# Patient Record
Sex: Female | Born: 1985
Health system: Southern US, Community
[De-identification: ages and names within clinical notes are randomized; demographics above are authoritative.]

## PROBLEM LIST (undated history)

## (undated) HISTORY — PX: CHOLECYSTECTOMY: SHX55

---

## 2017-09-17 DIAGNOSIS — Z361 Encounter for antenatal screening for raised alphafetoprotein level: Secondary | ICD-10-CM | POA: Diagnosis not present

## 2017-10-08 DIAGNOSIS — Z363 Encounter for antenatal screening for malformations: Secondary | ICD-10-CM | POA: Diagnosis not present

## 2017-12-03 DIAGNOSIS — Z3A27 27 weeks gestation of pregnancy: Secondary | ICD-10-CM | POA: Diagnosis not present

## 2017-12-03 DIAGNOSIS — Z3493 Encounter for supervision of normal pregnancy, unspecified, third trimester: Secondary | ICD-10-CM | POA: Diagnosis not present

## 2017-12-03 DIAGNOSIS — I82409 Acute embolism and thrombosis of unspecified deep veins of unspecified lower extremity: Secondary | ICD-10-CM | POA: Diagnosis not present

## 2017-12-03 DIAGNOSIS — Z130 Encounter for screening for diseases of the blood and blood-forming organs and certain disorders involving the immune mechanism: Secondary | ICD-10-CM | POA: Diagnosis not present

## 2017-12-03 DIAGNOSIS — Z23 Encounter for immunization: Secondary | ICD-10-CM | POA: Diagnosis not present

## 2017-12-09 DIAGNOSIS — O99810 Abnormal glucose complicating pregnancy: Secondary | ICD-10-CM | POA: Diagnosis not present

## 2018-01-23 DIAGNOSIS — M545 Low back pain: Secondary | ICD-10-CM | POA: Diagnosis not present

## 2018-01-23 DIAGNOSIS — Z3A34 34 weeks gestation of pregnancy: Secondary | ICD-10-CM | POA: Diagnosis not present

## 2018-01-23 DIAGNOSIS — O26899 Other specified pregnancy related conditions, unspecified trimester: Secondary | ICD-10-CM | POA: Diagnosis not present

## 2018-01-28 DIAGNOSIS — R309 Painful micturition, unspecified: Secondary | ICD-10-CM | POA: Diagnosis not present

## 2018-01-28 DIAGNOSIS — M545 Low back pain: Secondary | ICD-10-CM | POA: Diagnosis not present

## 2018-01-30 DIAGNOSIS — R1084 Generalized abdominal pain: Secondary | ICD-10-CM | POA: Diagnosis not present

## 2018-01-30 DIAGNOSIS — R319 Hematuria, unspecified: Secondary | ICD-10-CM | POA: Diagnosis not present

## 2018-01-30 DIAGNOSIS — O9989 Other specified diseases and conditions complicating pregnancy, childbirth and the puerperium: Secondary | ICD-10-CM | POA: Diagnosis not present

## 2018-01-30 DIAGNOSIS — O26893 Other specified pregnancy related conditions, third trimester: Secondary | ICD-10-CM | POA: Diagnosis not present

## 2018-01-30 DIAGNOSIS — Z3A35 35 weeks gestation of pregnancy: Secondary | ICD-10-CM | POA: Diagnosis not present

## 2018-01-30 DIAGNOSIS — Z791 Long term (current) use of non-steroidal anti-inflammatories (NSAID): Secondary | ICD-10-CM | POA: Diagnosis not present

## 2018-01-30 DIAGNOSIS — M549 Dorsalgia, unspecified: Secondary | ICD-10-CM | POA: Diagnosis not present

## 2018-02-04 DIAGNOSIS — Z3493 Encounter for supervision of normal pregnancy, unspecified, third trimester: Secondary | ICD-10-CM | POA: Diagnosis not present

## 2018-03-02 DIAGNOSIS — O43893 Other placental disorders, third trimester: Secondary | ICD-10-CM | POA: Diagnosis not present

## 2018-03-02 DIAGNOSIS — Z3A40 40 weeks gestation of pregnancy: Secondary | ICD-10-CM | POA: Diagnosis not present

## 2018-03-02 DIAGNOSIS — Z8279 Family history of other congenital malformations, deformations and chromosomal abnormalities: Secondary | ICD-10-CM | POA: Diagnosis not present

## 2018-03-02 DIAGNOSIS — N5089 Other specified disorders of the male genital organs: Secondary | ICD-10-CM | POA: Diagnosis not present

## 2018-03-02 DIAGNOSIS — O480 Post-term pregnancy: Secondary | ICD-10-CM | POA: Diagnosis not present

## 2018-03-02 DIAGNOSIS — O368330 Maternal care for abnormalities of the fetal heart rate or rhythm, third trimester, not applicable or unspecified: Secondary | ICD-10-CM | POA: Diagnosis not present

## 2018-05-06 DIAGNOSIS — Z130 Encounter for screening for diseases of the blood and blood-forming organs and certain disorders involving the immune mechanism: Secondary | ICD-10-CM | POA: Diagnosis not present

## 2018-05-06 DIAGNOSIS — Z6838 Body mass index (BMI) 38.0-38.9, adult: Secondary | ICD-10-CM | POA: Diagnosis not present

## 2018-10-10 ENCOUNTER — Encounter: Payer: Self-pay | Admitting: Registered Nurse

## 2018-10-10 ENCOUNTER — Ambulatory Visit (INDEPENDENT_AMBULATORY_CARE_PROVIDER_SITE_OTHER): Payer: BC Managed Care – PPO

## 2018-10-10 ENCOUNTER — Ambulatory Visit: Payer: BC Managed Care – PPO | Admitting: Registered Nurse

## 2018-10-10 ENCOUNTER — Other Ambulatory Visit: Payer: Self-pay

## 2018-10-10 VITALS — BP 121/82 | HR 95 | Temp 99.0°F | Resp 18 | Ht 64.37 in | Wt 242.0 lb

## 2018-10-10 DIAGNOSIS — G8929 Other chronic pain: Secondary | ICD-10-CM

## 2018-10-10 DIAGNOSIS — M545 Low back pain, unspecified: Secondary | ICD-10-CM

## 2018-10-10 NOTE — Patient Instructions (Signed)
° ° ° °  If you have lab work done today you will be contacted with your lab results within the next 2 weeks.  If you have not heard from us then please contact us. The fastest way to get your results is to register for My Chart. ° ° °IF you received an x-ray today, you will receive an invoice from [facility]. Please contact [facility] at [phone] with questions or concerns regarding your invoice.  ° °IF you received labwork today, you will receive an invoice from [facility]. Please contact [facility] at [phone] with questions or concerns regarding your invoice.  ° °Our billing staff will not be able to assist you with questions regarding bills from these companies. ° °You will be contacted with the lab results as soon as they are available. The fastest way to get your results is to activate your My Chart account. Instructions are located on the last page of this paperwork. If you have not heard from us regarding the results in 2 weeks, please contact this office. °  ° ° ° °

## 2018-10-10 NOTE — Progress Notes (Signed)
New Patient Office Visit  Subjective:  Patient ID: Erica Wilkinson, female    DOB: 20-Jun-1985  Age: 33 y.o. MRN: [id number]  CC:  Chief Complaint  Patient presents with  . Establish Care  . Back Pain    pt states she has been having pain/spasms in her lower back since December after child birth    HPI Erica Wilkinson presents for visit to establish care and back pain/spasm  States that this started in December after childbirth and has gotten worse. She states that she had an epidural, and the anesthesiologist had told her that this was likely an after effect. However, She has been in contact with the anesthesiologist since, who stated that it was likely not an effect of the epidural. She has had a history of back pain, mentioning that she has seen chiropractors in the past. However, she hasn't found one since moving to [place] from [place].  Denies numbness, weakness, tingling in legs. Denies sciatic pain. Denies loss of bowel or bladder control. States that she has gained more weight since delivering than she gained during pregnancy. Will order Xr of lumbar spine on site today.   Otherwise, denies major medical history. Hoping to establish care today as well, as she does not have a PCP in [place] yet.   No past medical history on file.  Past Surgical History:  Procedure Laterality Date  . CHOLECYSTECTOMY      No family history on file.  Social History   Socioeconomic History  . Marital status: Single    Spouse name: Not on file  . Number of children: Not on file  . Years of education: Not on file  . Highest education level: Not on file  Occupational History  . Not on file  Social Needs  . Financial resource strain: Not on file  . Food insecurity    Worry: Not on file    Inability: Not on file  . Transportation needs    Medical: Not on file    Non-medical: Not on file  Tobacco Use  . Smoking status: Former Research scientist (life sciences)  . Smokeless tobacco: Never Used  Substance and Sexual Activity  .  Alcohol use: Not on file  . Drug use: Not on file  . Sexual activity: Not on file  Lifestyle  . Physical activity    Days per week: Not on file    Minutes per session: Not on file  . Stress: Not on file  Relationships  . Social Herbalist on phone: Not on file    Gets together: Not on file    Attends religious service: Not on file    Active member of club or organization: Not on file    Attends meetings of clubs or organizations: Not on file    Relationship status: Not on file  . Intimate partner violence    Fear of current or ex partner: Not on file    Emotionally abused: Not on file    Physically abused: Not on file    Forced sexual activity: Not on file  Other Topics Concern  . Not on file  Social History Narrative  . Not on file    ROS Review of Systems  Constitutional: Negative.   HENT: Negative.   Eyes: Negative.   Respiratory: Negative.   Cardiovascular: Negative.   Gastrointestinal: Negative.   Endocrine: Negative.   Genitourinary: Negative.   Musculoskeletal: Positive for arthralgias (L knee), back pain and myalgias (lower back).  Skin:  Negative.   Allergic/Immunologic: Negative.   Neurological: Negative.   Hematological: Negative.   Psychiatric/Behavioral: Negative.   All other systems reviewed and are negative.   Objective:   Today's Vitals: BP 121/82   Pulse 95   Temp 99 F (37.2 C) (Oral)   Resp 18   Ht 5' 4.37" (1.635 m)   Wt 242 lb (109.8 kg)   LMP 09/12/2018 (Approximate)   SpO2 97%   BMI 41.06 kg/m   Physical Exam Vitals signs and nursing note reviewed.  Constitutional:      General: She is not in acute distress.    Appearance: Normal appearance. She is normal weight. She is not ill-appearing, toxic-appearing or diaphoretic.  HENT:     Head: Normocephalic and atraumatic.  Cardiovascular:     Rate and Rhythm: Normal rate and regular rhythm.  Pulmonary:     Effort: Pulmonary effort is normal. No respiratory distress.   Musculoskeletal:     Lumbar back: She exhibits decreased range of motion and deformity.  Skin:    General: Skin is warm and dry.     Coloration: Skin is not jaundiced or pale.     Findings: No bruising, erythema, lesion or rash.  Neurological:     General: No focal deficit present.     Mental Status: She is alert. Mental status is at baseline.  Psychiatric:        Mood and Affect: Mood normal.        Behavior: Behavior normal.        Thought Content: Thought content normal.        Judgment: Judgment normal.     Assessment & Plan:   Problem List Items Addressed This Visit    None    Visit Diagnoses    Low back pain, unspecified back pain laterality, unspecified chronicity, unspecified whether sciatica present    -  Primary   Relevant Orders   DG Lumbar Spine Complete (Completed)      No outpatient encounter medications on file as of 10/10/2018.   No facility-administered encounter medications on file as of 10/10/2018.     Follow-up: No follow-ups on file.   PLAN  XR of lumbar spine shows scoliosis. Maintains good joint spacing. At this time, likely that she is experiencing back pain from a combination of muscle imbalance, recent weight gain, and longstanding scoliosis. We will refer to physical therapy - in absence of red flag symptoms, we can avoid a spine specialist at this time.   Reviewed in depth the reasons for further concern: loss of bowel or bladder control, numbness, tingling, or burning in extremities, weakness in one or both feet, and more.  Otherwise, we did review her medical history today and established care. She seems, besides her back pain, healthy for the most part.  Discussed non pharm: exercise, weight loss.  Follow up in around 3 mos for condition check, CPE  Patient encouraged to call clinic with any questions, comments, or concerns.  Janeece Ageeichard Manroop Jakubowicz, NP

## 2018-11-13 ENCOUNTER — Encounter: Payer: Self-pay | Admitting: Physical Therapy

## 2018-11-13 ENCOUNTER — Other Ambulatory Visit: Payer: Self-pay

## 2018-11-13 ENCOUNTER — Ambulatory Visit: Payer: BC Managed Care – PPO | Attending: Registered Nurse | Admitting: Physical Therapy

## 2018-11-13 DIAGNOSIS — M545 Low back pain: Secondary | ICD-10-CM | POA: Diagnosis not present

## 2018-11-13 DIAGNOSIS — G8929 Other chronic pain: Secondary | ICD-10-CM

## 2018-11-13 NOTE — Therapy (Signed)
[facility] [address] [place], [place], [address] Phone: [phone]   Fax:  [phone]  Physical Therapy Evaluation  Patient Details  Name: Erica Wilkinson MRN: [id number] Date of Birth: 1985/09/22 Referring Provider (PT): Janeece Agee, NP   Encounter Date: 11/13/2018  PT End of Session - 11/13/18 1112    Visit Number  1    Number of Visits  12    Date for PT Re-Evaluation  12/25/18    Authorization Type  BCBS    PT Start Time  1007    PT Stop Time  1100    PT Time Calculation (min)  53 min    Activity Tolerance  Patient tolerated treatment well    Behavior During Therapy  [facility] for tasks assessed/performed       History reviewed. No pertinent past medical history.  Past Surgical History:  Procedure Laterality Date  . CHOLECYSTECTOMY      There were no vitals filed for this visit.   Subjective Assessment - 11/13/18 1014    Subjective  She relays Lower back pain r/t scoliosis. No red flag symptoms. Recent imaging on file. L leg likely shorter than R leg d/t scoliosis and former heel lift. States that this started in December after childbirth and has gotten worse.She has had a history of back pain, mentioning that she has seen chiropractors in the past. However, she hasn't found one since moving to [place] from [place]. Pain with bending or picking up, or carrying son. She relays Lt leg is shorter and sheDenies numbness, weakness, tingling in legs. Denies sciatic pain.    Pertinent History  scoliosis, recent childbirth in December 2019    Limitations  Lifting;Standing;Walking;House hold activities    How long can you sit comfortably?  not limited    How long can you stand comfortably?  has to stand 8 hours at work and pain after 6 hours    How long can you walk comfortably?  not limited unless hours of walking    Diagnostic tests  lumbar XR: There is thoracolumbar levoscoliosis with slight rotatory component. There is no fracture  or spondylolisthesis.    Patient Stated Goals  get rid of the pain    Currently in Pain?  Yes    Pain Score  6     Pain Location  Back    Pain Orientation  Lower;Right    Pain Descriptors / Indicators  Aching;Throbbing;Burning    Pain Type  Chronic pain    Pain Radiating Towards  denies    Pain Onset  More than a month ago    Pain Frequency  Intermittent    Aggravating Factors   bending, twisting, lifting    Pain Relieving Factors  tyelonol and rest         [facility] PT Assessment - 11/13/18 0001      Assessment   Medical Diagnosis  Lower back pain r/t scoliosis.    Referring Provider (PT)  Janeece Agee, NP    Onset Date/Surgical Date  --   More pain since December 2020 after giving birth   Hand Dominance  Right    Next MD Visit  6 months or as needed    Prior Therapy  none, has tried chiro      Precautions   Precautions  None      Balance Screen   Has the patient fallen in the past 6 months  No      Home Environment   Living  Environment  Private residence      Prior Function   Level of Independence  Independent    Vocation  Full time employment    Vocation Requirements  works at [employer] so has to be up on her feet 8 hours and do some lifting, pushing, pulling, bending      Cognition   Overall Cognitive Status  Within Functional Limits for tasks assessed      Observation/Other Assessments   Scoliosis   There is thoracolumbar levoscoliosis with slight rotary component    Focus on Therapeutic Outcomes (FOTO)   was not set up for her      Sensation   Light Touch  Appears Intact      Coordination   Gross Motor Movements are Fluid and Coordinated  Yes      Posture/Postural Control   Posture Comments  mild scoliosis, consider further pelvis allignment and LLD next visit      ROM / Strength   AROM / PROM / Strength  AROM;Strength      AROM   Overall AROM Comments  pain with sidebending only on Rt lumbar    AROM Assessment Site  Lumbar    Lumbar Flexion  75%     Lumbar Extension  WNL    Lumbar - Right Side Bend  50%    Lumbar - Left Side Bend  50%    Lumbar - Right Rotation  WNL    Lumbar - Left Rotation  WNL      Strength   Overall Strength Comments  leg strength WNL bilat      Flexibility   Soft Tissue Assessment /Muscle Length  --   very tight hamstrings, mod tight lumbar P.S Rt more than Lt     Palpation   Spinal mobility  normal    Palpation comment  TTP Rt lumbar paraspinals and mild pain L3-5      Special Tests   Other special tests  neg SLR ,neg slump test, neg FABERS test, no significant change with repeated flexion or extension but maybe flexion a little better      Transfers   Transfers  Independent with all Transfers      Ambulation/Gait   Gait Comments  WNL pattern and velocity                Objective measurements completed on examination: See above findings.      [facility] Adult PT Treatment/Exercise - 11/13/18 0001      Modalities   Modalities  Electrical Stimulation;Moist Heat      Moist Heat Therapy   Number Minutes Moist Heat  10 Minutes    Moist Heat Location  Lumbar Spine      Electrical Stimulation   Electrical Stimulation Location  lumbar    Electrical Stimulation Action  IFC    Electrical Stimulation Parameters  tolerance    Electrical Stimulation Goals  Pain      Manual Therapy   Manual therapy comments  LAD rt leg both with hip IR and ER, SL lumbar mobs/manipulation              PT Education - 11/13/18 1111    Education Details  HEP, POC, exam findings, TENS    Person(s) Educated  Patient    Methods  Explanation;Demonstration;Verbal cues;Handout    Comprehension  Verbalized understanding;Need further instruction          PT Long Term Goals - 11/13/18 1119      PT LONG  TERM GOAL #1   Title  Pt will be I and compliant with HEP. (Target goal for all goals 6 weeks 12/25/18)    Status  New      PT LONG TERM GOAL #2   Title  Pt will improve lumbar ROM to WNL to improve  mobility    Status  New      PT LONG TERM GOAL #3   Title  Pt will reduce pain by overall 50%    Baseline  6    Status  New      PT LONG TERM GOAL #4   Title  Pt will report improved sleeping quality due to less pain    Status  New      PT LONG TERM GOAL #5   Title  Pt will demonstrate appropriate body mechanics with lifting as she needs to do this at work and to pick up her young child.    Status  New             Plan - 11/13/18 1112    Clinical Impression Statement  Pt presents with LBP and mild thoracolumbar levoscoliosis. Pain is chronic in nature and made worse with recent childbirth. Special testing negative today and her pain appears to be more muscular in nature and more Rt sided. She has overall decreased lumbar ROM, decresaed core strength, poor posture, and increased pain paticularly with lifting, bending, twisting, or prolonged standing. She will benefit from skilled PT to address her defcitis.    Personal Factors and Comorbidities  Comorbidity 1;Time since onset of injury/illness/exacerbation    Comorbidities  chronic pain made worse by recent childbirth, scoliosis.    Examination-Activity Limitations  Bend;Squat;Stand;Lift;Sleep    Examination-Participation Restrictions  Meal Prep;Cleaning;Community Activity;Laundry    Stability/Clinical Decision Making  Evolving/Moderate complexity    Clinical Decision Making  Moderate    Rehab Potential  Good    PT Frequency  2x / week   1-2   PT Duration  6 weeks    PT Treatment/Interventions  Aquatic Therapy;Cryotherapy;Electrical Stimulation;Iontophoresis 4mg /ml Dexamethasone;Moist Heat;Traction;Ultrasound;Therapeutic activities;Therapeutic exercise;Neuromuscular re-education;Manual techniques;Passive range of motion;Dry needling;Joint Manipulations;Spinal Manipulations;Taping    PT Next Visit Plan  how was HEP, did she get TENS unit, needs lumbar and hamstring stretching, core, go over bodymechanics and lifting mechanics as  she does this for work, consider manipulations, traction, DN if necessary    PT Home Exercise Plan  [facility], child pose in front and to her Lt (to stretch Rt more), HSS, bird dog, prone lying on 2 pillows    Consulted and Agree with Plan of Care  Patient       Patient will benefit from skilled therapeutic intervention in order to improve the following deficits and impairments:  Decreased activity tolerance, Decreased strength, Decreased range of motion, Impaired flexibility, Increased muscle spasms, Increased fascial restricitons, Postural dysfunction, Pain, Improper body mechanics  Visit Diagnosis: Chronic bilateral low back pain without sciatica     Problem List There are no active problems to display for this patient.   Birdie RiddleBrian R Layani Foronda,PT,DPT 11/13/2018, 11:22 AM  [facility] [address] [place], [place], [address] Phone: [phone]   Fax:  [phone]  Name: Erica Wilkinson MRN: [id number] Date of Birth: 11/27/1985

## 2018-11-13 NOTE — Patient Instructions (Addendum)
Access Code: [email or web address]  URL: [email or web address]  Date: 11/13/2018  Prepared by: Elsie Ra   Exercises  Supine Single Knee to Chest - 3 sets - 30 hold - 2x daily - 6x weekly  Child's Pose Stretch - 3 sets - 30 hold - 2x daily - 6x weekly  Child's Pose with Sidebending - 3 sets - 30 hold - 2x daily - 6x weekly  Lying Prone with 2 Pillows - 10 reps - 3 sets - 2x daily - 6x weekly  Bird Dog - 10 reps - 1 sets - 5 sec hold - 2x daily - 6x weekly  Seated Hamstring Stretch - 3 sets - 30 hold - 2x daily - 6x weekly   TENS UNIT: This is helpful for muscle pain and spasm.   Search and Purchase a TENS 7000 2nd edition at www.tenspros.com. It should be less than $30.     TENS unit instructions: Do not shower or bathe with the unit on Turn the unit off before removing electrodes or batteries If the electrodes lose stickiness add a drop of water to the electrodes after they are disconnected from the unit and place on plastic sheet. If you continued to have difficulty, call the TENS unit company to purchase more electrodes. Do not apply lotion on the skin area prior to use. Make sure the skin is clean and dry as this will help prolong the life of the electrodes. After use, always check skin for unusual red areas, rash or other skin difficulties. If there are any skin problems, does not apply electrodes to the same area. Never remove the electrodes from the unit by pulling the wires. Do not use the TENS unit or electrodes other than as directed. Do not change electrode placement without consultating your therapist or physician. Keep 2 fingers with between each electrode. Wear time ratio is 2:1, on to off times.    For example on for 30 minutes off for 15 minutes and then on for 30 minutes off for 15 minutes

## 2018-11-17 ENCOUNTER — Other Ambulatory Visit: Payer: Self-pay

## 2018-11-17 ENCOUNTER — Ambulatory Visit: Payer: BC Managed Care – PPO | Admitting: Physical Therapy

## 2018-11-17 ENCOUNTER — Encounter: Payer: Self-pay | Admitting: Physical Therapy

## 2018-11-17 DIAGNOSIS — G8929 Other chronic pain: Secondary | ICD-10-CM | POA: Diagnosis not present

## 2018-11-17 DIAGNOSIS — M545 Low back pain: Secondary | ICD-10-CM | POA: Diagnosis not present

## 2018-11-17 NOTE — Therapy (Signed)
[place], [place], [address] Phone: [phone]   Fax:  [phone]  Physical Therapy Treatment  Patient Details  Name: Erica Wilkinson MRN: [id number] Date of Birth: [birth year] Referring Provider (PT): Maximiano Coss, NP   Encounter Date: 11/17/2018  PT End of Session - 11/17/18 1023    Visit Number  2    Number of Visits  12    Date for PT Re-Evaluation  12/25/18    Authorization Type  BCBS    PT Start Time  8875   in late   PT Stop Time  1102    PT Time Calculation (min)  39 min    Activity Tolerance  Patient tolerated treatment well    Behavior During Therapy  [facility] for tasks assessed/performed       History reviewed. No pertinent past medical history.  Past Surgical History:  Procedure Laterality Date  . CHOLECYSTECTOMY      There were no vitals filed for this visit.  Subjective Assessment - 11/17/18 1023    Subjective  Pt reports she hasn't really had any pain, has doen the HEP for 2 days and is doing well with them.    Currently in Pain?  No/denies         [facility] PT Assessment - 11/17/18 0001      Assessment   Medical Diagnosis  Lower back pain r/t scoliosis.    Referring Provider (PT)  Maximiano Coss, NP                   [facility] Adult PT Treatment/Exercise - 11/17/18 0001      Self-Care   Self-Care  Lifting;ADL's    ADL's  per handout    Lifting  per handout      Exercises   Exercises  Lumbar      Lumbar Exercises: Stretches   Passive Hamstring Stretch  Left;Right;3 reps;30 seconds   supine with strap   ITB Stretch  Left;Right;3 reps;30 seconds   supine cross body with strap     Lumbar Exercises: Aerobic   Stationary Bike  L3x5'      Lumbar Exercises: Standing   Other Standing Lumbar Exercises  pracitced hip hinging progressed to deadlift - added in 15# 2x10 from stool     Other Standing Lumbar Exercises  2x10 hip abduction with UE reach to engage obliques  and QL VC for form      Lumbar Exercises: Quadruped   Opposite Arm/Leg Raise  Left arm/Right leg;Right arm/Left leg;10 reps   drawing knee to elbow, then only knee in 10 reps            PT Education - 11/17/18 1029    Education Details  body mechanics    Person(s) Educated  Patient    Methods  Explanation;Demonstration;Handout    Comprehension  Returned demonstration;Verbalized understanding          PT Long Term Goals - 11/13/18 1119      PT LONG TERM GOAL #1   Title  Pt will be I and compliant with HEP. (Target goal for all goals 6 weeks 12/25/18)    Status  New      PT LONG TERM GOAL #2   Title  Pt will improve lumbar ROM to WNL to improve mobility    Status  New      PT LONG TERM GOAL #3   Title  Pt will reduce pain by overall 50%  Baseline  6    Status  New      PT LONG TERM GOAL #4   Title  Pt will report improved sleeping quality due to less pain    Status  New      PT LONG TERM GOAL #5   Title  Pt will demonstrate appropriate body mechanics with lifting as she needs to do this at work and to pick up her young child.    Status  New            Plan - 11/17/18 1050    Clinical Impression Statement  This is Micaela's second visit.  She is doing well with her initial HEP.  WAs able to verbalize understanding of Arts development officer education and will work on using these at home and work.  No goals met at this time.    Rehab Potential  Good    PT Frequency  2x / week    PT Duration  6 weeks    PT Treatment/Interventions  Aquatic Therapy;Cryotherapy;Electrical Stimulation;Iontophoresis 44m/ml Dexamethasone;Moist Heat;Traction;Ultrasound;Therapeutic activities;Therapeutic exercise;Neuromuscular re-education;Manual techniques;Passive range of motion;Dry needling;Joint Manipulations;Spinal Manipulations;Taping    PT Next Visit Plan  see if she was able to practice safe body mechanics and cont with functional core strength and low back/hip flexibility        Patient will benefit from skilled therapeutic intervention in order to improve the following deficits and impairments:  Decreased activity tolerance, Decreased strength, Decreased range of motion, Impaired flexibility, Increased muscle spasms, Increased fascial restricitons, Postural dysfunction, Pain, Improper body mechanics  Visit Diagnosis: Chronic bilateral low back pain without sciatica     Problem List There are no active problems to display for this patient.   SJeral PinchPT  11/17/2018, 11:03 AM  [facility] [address]Phone: [phone]  Fax:  [phone] Name: EDula HavlikMRN: [id number] of Birth: 2May 25, 1987

## 2018-11-17 NOTE — Patient Instructions (Signed)
Access Code: [email or web address]  URL: [email or web address]  Date: 11/17/2018  Prepared by: Jeral Pinch   Patient Education  Posture and Body Mechanics

## 2018-11-26 ENCOUNTER — Other Ambulatory Visit: Payer: Self-pay

## 2018-11-26 ENCOUNTER — Encounter: Payer: Self-pay | Admitting: Physical Therapy

## 2018-11-26 ENCOUNTER — Ambulatory Visit: Payer: BC Managed Care – PPO | Admitting: Physical Therapy

## 2018-11-26 DIAGNOSIS — M545 Low back pain, unspecified: Secondary | ICD-10-CM

## 2018-11-26 DIAGNOSIS — G8929 Other chronic pain: Secondary | ICD-10-CM | POA: Diagnosis not present

## 2018-11-26 NOTE — Therapy (Signed)
[facility] [address] [place], [place], [address] Phone: [phone]   Fax:  [phone]  Physical Therapy Treatment  Patient Details  Name: Erica Wilkinson MRN: [id number] Date of Birth: 19-Apr-1985 Referring Provider (PT): Janeece Agee, NP   Encounter Date: 11/26/2018  PT End of Session - 11/26/18 1030    Visit Number  3    Number of Visits  12    Date for PT Re-Evaluation  12/25/18    Authorization Type  BCBS    PT Start Time  1023   Patient 7 minutes late   PT Stop Time  1101    PT Time Calculation (min)  38 min    Activity Tolerance  Patient tolerated treatment well    Behavior During Therapy  [facility] for tasks assessed/performed       History reviewed. No pertinent past medical history.  Past Surgical History:  Procedure Laterality Date  . CHOLECYSTECTOMY      There were no vitals filed for this visit.  Subjective Assessment - 11/26/18 1029    Subjective  Patient reports that her pain hasn't been too bad. She did not get too sore after the last visit. She has been workoing on her exercises.    Pertinent History  scoliosis, recent childbirth in December 2019    Limitations  Lifting;Standing;Walking;House hold activities    How long can you sit comfortably?  not limited    How long can you stand comfortably?  has to stand 8 hours at work and pain after 6 hours    How long can you walk comfortably?  not limited unless hours of walking    Diagnostic tests  lumbar XR: There is thoracolumbar levoscoliosis with slight rotatory component. There is no fracture or spondylolisthesis.    Patient Stated Goals  get rid of the pain    Currently in Pain?  No/denies                       [facility] Adult PT Treatment/Exercise - 11/26/18 0001      Lumbar Exercises: Stretches   Passive Hamstring Stretch  Left;Right;2 reps;20 seconds   supine with strap   Passive Hamstring Stretch Limitations  with strap    ITB  Stretch  Left;Right;30 seconds;2 reps   supine cross body with strap     Lumbar Exercises: Supine   AB Set Limitations  reviewed abdominal breathing x5     Clam Limitations  x10 with red band. Given for home     Bent Knee Raise Limitations  2x10 with abdominal bracing     Bridge Limitations  x10 given for home.     Straight Leg Raise  10 reps      Lumbar Exercises: Quadruped   Opposite Arm/Leg Raise  Right arm/Left leg;Left arm/Right leg;5 reps      Manual Therapy   Manual therapy comments  bilateral LAD. Patient also given a heel lift as she has had benefit from this in the past. Showed patient how to insert and adjust.              PT Education - 11/26/18 1030    Education Details  reviewed technique with ther-ex    Person(s) Educated  Patient    Methods  Explanation;Demonstration;Tactile cues;Verbal cues    Comprehension  Verbalized understanding;Returned demonstration;Verbal cues required;Tactile cues required          PT Long Term Goals - 11/26/18 1251  PT LONG TERM GOAL #1   Title  Pt will be I and compliant with HEP. (Target goal for all goals 6 weeks 12/25/18)    Baseline  No HEP    Time  6    Period  Weeks    Status  New      PT LONG TERM GOAL #2   Title  Pt will improve lumbar ROM to WNL to improve mobility    Baseline  flexion limited 75% rotation limited 50% bilateral    Time  6    Period  Weeks    Status  New      PT LONG TERM GOAL #3   Title  Pt will reduce pain by overall 50%    Baseline  Pain can reach a 5/10. Progressing towards goal    Time  6    Period  Weeks    Status  New      PT LONG TERM GOAL #4   Title  Pt will report improved sleeping quality due to less pain    Baseline  Sleeping better but still not sleeping through the night    Time  6    Period  Weeks    Status  New      PT LONG TERM GOAL #5   Title  Pt will demonstrate appropriate body mechanics with lifting as she needs to do this at work and to pick up her young  child.    Baseline  worked on hip hinge today but needs further cuing    Time  6    Period  Weeks    Status  New            Plan - 11/26/18 1244    Clinical Impression Statement  The patient is making progress. She had no pain with mat exercises today.She continues to require cuing for proper hip hinge. She wpiuld benefit from skilled therapy 1w1 and 2W1 per [employer] guidlines. Overall she present with limited lumbar flexion (75% limited) and bilateral rotation (50% limited) She hads a right leg length decrepency. She was given a shoe lift today and was advised to bring her shoes in next time.    Personal Factors and Comorbidities  Comorbidity 1;Time since onset of injury/illness/exacerbation    Comorbidities  chronic pain made worse by recent childbirth, scoliosis.    Examination-Activity Limitations  Bend;Squat;Stand;Lift;Sleep    Examination-Participation Restrictions  Meal Prep;Cleaning;Community Activity;Laundry    Stability/Clinical Decision Making  Evolving/Moderate complexity    Clinical Decision Making  Moderate    Rehab Potential  Good    PT Treatment/Interventions  Aquatic Therapy;Cryotherapy;Electrical Stimulation;Iontophoresis 4mg /ml Dexamethasone;Moist Heat;Traction;Ultrasound;Therapeutic activities;Therapeutic exercise;Neuromuscular re-education;Manual techniques;Passive range of motion;Dry needling;Joint Manipulations;Spinal Manipulations;Taping    PT Next Visit Plan  continue to work on hip hinge; continue to progress core strength, Assess shoe lift if she brings her shoes in .    PT Home Exercise Plan  [facility], child pose in front and to her Lt (to stretch Rt more), HSS, bird dog, prone lying on 2 pillows; Bridge, SLR, clamshell    Consulted and Agree with Plan of Care  Patient       Patient will benefit from skilled therapeutic intervention in order to improve the following deficits and impairments:  Decreased activity tolerance, Decreased strength, Decreased range of  motion, Impaired flexibility, Increased muscle spasms, Increased fascial restricitons, Postural dysfunction, Pain, Improper body mechanics  Visit Diagnosis: Chronic bilateral low back pain without sciatica     Problem List  There are no active problems to display for this patient.   Carney Living PT DPT  11/26/2018, 1:10 PM  [facility] [address] [place], [place], [address] Phone: [phone]   Fax:  [phone]  Name: Erica Wilkinson MRN: [id number] Date of Birth: February 06, 1986

## 2018-11-28 ENCOUNTER — Other Ambulatory Visit: Payer: Self-pay

## 2018-11-28 ENCOUNTER — Ambulatory Visit: Payer: BC Managed Care – PPO | Admitting: Physical Therapy

## 2018-11-28 ENCOUNTER — Encounter: Payer: Self-pay | Admitting: Physical Therapy

## 2018-11-28 DIAGNOSIS — G8929 Other chronic pain: Secondary | ICD-10-CM

## 2018-11-28 DIAGNOSIS — M545 Low back pain: Secondary | ICD-10-CM | POA: Diagnosis not present

## 2018-11-28 NOTE — Therapy (Signed)
[place], [place], [address] Phone: [phone]   Fax:  [phone]  Physical Therapy Treatment  Patient Details  Name: Vertie Dibbern MRN: [id number] Date of Birth: 1985-03-18 Referring Provider (PT): Maximiano Coss, NP   Encounter Date: 11/28/2018  PT End of Session - 11/28/18 1316    Visit Number  4    Number of Visits  12    Date for PT Re-Evaluation  12/25/18    Authorization Type  BCBS    PT Start Time  1021   Patient 56 minutes late   PT Stop Time  1100    PT Time Calculation (min)  39 min    Activity Tolerance  Patient tolerated treatment well    Behavior During Therapy  [facility] for tasks assessed/performed       History reviewed. No pertinent past medical history.  Past Surgical History:  Procedure Laterality Date  . CHOLECYSTECTOMY      There were no vitals filed for this visit.  Subjective Assessment - 11/28/18 1026    Subjective  Patient feels like she is a little tight this morning. She has been doing Medical sales representative. She had no increase in pain after the last visit.    Pertinent History  scoliosis, recent childbirth in December 2019    Limitations  Lifting;Standing;Walking;House hold activities    How long can you sit comfortably?  not limited    How long can you stand comfortably?  has to stand 8 hours at work and pain after 6 hours    How long can you walk comfortably?  not limited unless hours of walking    Diagnostic tests  lumbar XR: There is thoracolumbar levoscoliosis with slight rotatory component. There is no fracture or spondylolisthesis.    Currently in Pain?  No/denies   just tightness in the lower back                      [facility] Adult PT Treatment/Exercise - 11/28/18 0001      Lumbar Exercises: Stretches   Passive Hamstring Stretch  Left;Right;2 reps;20 seconds   supine with strap   Passive Hamstring Stretch Limitations  with strap    ITB Stretch   Left;Right;30 seconds;2 reps   supine cross body with strap     Lumbar Exercises: Aerobic   Stationary Bike  L3x5'      Lumbar Exercises: Standing   Other Standing Lumbar Exercises  hip abduction 2x10       Lumbar Exercises: Supine   Clam Limitations  x10 with red band. Given for home     Bent Knee Raise Limitations  2x10 with abdominal bracing     Bridge Limitations  2x10       Manual Therapy   Manual therapy comments  bilateral LAD. Patient also given a heel lift as she has had benefit from this in the past. Showed patient how to insert and adjust.  Assessed  soft tissue on the right side. No trigger noted              PT Education - 11/28/18 1029    Education Details  technique with ther-ex    Person(s) Educated  Patient    Methods  Explanation;Demonstration;Verbal cues;Tactile cues    Comprehension  Verbalized understanding;Verbal cues required;Tactile cues required;Returned demonstration          PT Long Term Goals - 11/26/18 1251      PT LONG TERM GOAL #1  Title  Pt will be I and compliant with HEP. (Target goal for all goals 6 weeks 12/25/18)    Baseline  No HEP    Time  6    Period  Weeks    Status  New      PT LONG TERM GOAL #2   Title  Pt will improve lumbar ROM to WNL to improve mobility    Baseline  flexion limited 75% rotation limited 50% bilateral    Time  6    Period  Weeks    Status  New      PT LONG TERM GOAL #3   Title  Pt will reduce pain by overall 50%    Baseline  Pain can reach a 5/10. Progressing towards goal    Time  6    Period  Weeks    Status  New      PT LONG TERM GOAL #4   Title  Pt will report improved sleeping quality due to less pain    Baseline  Sleeping better but still not sleeping through the night    Time  6    Period  Weeks    Status  New      PT LONG TERM GOAL #5   Title  Pt will demonstrate appropriate body mechanics with lifting as she needs to do this at work and to pick up her young child.    Baseline   worked on hip hinge today but needs further cuing    Time  6    Period  Weeks    Status  New            Plan - 11/28/18 1316    Clinical Impression Statement  Patient given standing exercises. She had no increase in pain. She is just having tightness at this point. Therapy will continue to progress core strengthening and stretching as tolerated.    Examination-Participation Restrictions  Meal Prep;Cleaning;Community Activity;Laundry    Stability/Clinical Decision Making  Evolving/Moderate complexity    Clinical Decision Making  Moderate    Rehab Potential  Good    PT Duration  6 weeks    PT Treatment/Interventions  Aquatic Therapy;Cryotherapy;Electrical Stimulation;Iontophoresis 4mg /ml Dexamethasone;Moist Heat;Traction;Ultrasound;Therapeutic activities;Therapeutic exercise;Neuromuscular re-education;Manual techniques;Passive range of motion;Dry needling;Joint Manipulations;Spinal Manipulations;Taping    PT Next Visit Plan  continue to work on hip hinge; continue to progress core strength, Assess shoe lift if she brings her shoes in  Consdier sink stretch,    PT Home Exercise Plan  [facility], child pose in front and to her Lt (to stretch Rt more), HSS, bird dog, prone lying on 2 pillows; Bridge, SLR, clamshell       Patient will benefit from skilled therapeutic intervention in order to improve the following deficits and impairments:  Decreased activity tolerance, Decreased strength, Decreased range of motion, Impaired flexibility, Increased muscle spasms, Increased fascial restricitons, Postural dysfunction, Pain, Improper body mechanics  Visit Diagnosis: Chronic bilateral low back pain without sciatica     Problem List There are no active problems to display for this patient.    PT DPT  11/28/2018, 1:18 PM  [facility] [address] [place], [place], [place] Phone: [phone]   Fax:  [phone]  Name: Daneen Volcy MRN: Therisa Doyne Date of Birth: 12/27/1985

## 2018-12-02 ENCOUNTER — Ambulatory Visit: Payer: BC Managed Care – PPO | Admitting: Physical Therapy

## 2018-12-02 ENCOUNTER — Other Ambulatory Visit: Payer: Self-pay

## 2018-12-02 DIAGNOSIS — G8929 Other chronic pain: Secondary | ICD-10-CM

## 2018-12-02 DIAGNOSIS — M545 Low back pain, unspecified: Secondary | ICD-10-CM

## 2018-12-02 NOTE — Therapy (Signed)
[place], [place], [address] Phone: [phone]   Fax:  [phone]  Physical Therapy Treatment  Patient Details  Name: Erica Wilkinson MRN: [id number] Date of Birth: 10-27-1985 Referring Provider (PT): Maximiano Coss, NP   Encounter Date: 12/02/2018  PT End of Session - 12/02/18 1005    Visit Number  5    Number of Visits  12    Date for PT Re-Evaluation  12/25/18    Authorization Type  BCBS    PT Start Time  [phone]   pt late   PT Stop Time  1001    PT Time Calculation (min)  39 min    Activity Tolerance  Patient tolerated treatment well    Behavior During Therapy  [facility] for tasks assessed/performed       No past medical history on file.  Past Surgical History:  Procedure Laterality Date  . CHOLECYSTECTOMY      There were no vitals filed for this visit.  Subjective Assessment - 12/02/18 0944    Subjective  Pt relays no pain upon arrival but has pain if she stands too long, no more pain picking up her child but pain if she holds him too long    Pertinent History  scoliosis, recent childbirth in December 2019    Diagnostic tests  lumbar XR: There is thoracolumbar levoscoliosis with slight rotatory component. There is no fracture or spondylolisthesis.    Patient Stated Goals  get rid of the pain    Currently in Pain?  No/denies                       [facility] Adult PT Treatment/Exercise - 12/02/18 0001      Lumbar Exercises: Stretches   Single Knee to Chest Stretch  Right;Left;2 reps;30 seconds    Other Lumbar Stretch Exercise  child pose 30 sec X 2 fwd, and 30 sec X 2 going to her Lt to stretch her Rt      Lumbar Exercises: Machines for Strengthening   Other Lumbar Machine Exercise  cable machine rows and ext with 7 lbs each handle X 20 ea then push pull X 5 ea, then pilof press X 10 lbs one cable X 15 each side,      Lumbar Exercises: Standing   Other Standing Lumbar Exercises   functional deadlift from 8 inch step 10 lbs X 10, then farmers carry 10 lbs one lap (115 ft) in Rt hand and one lap in left hand      Lumbar Exercises: Supine   Bridge Limitations  2x10       Lumbar Exercises: Quadruped   Opposite Arm/Leg Raise  10 reps             PT Education - 12/02/18 1004    Education Details  lifting mechanics    Person(s) Educated  Patient    Methods  Explanation;Demonstration;Verbal cues    Comprehension  Verbalized understanding;Returned demonstration          PT Long Term Goals - 12/02/18 1007      PT LONG TERM GOAL #1   Title  Pt will be I and compliant with HEP. (Target goal for all goals 6 weeks 12/25/18)    Baseline  No HEP    Time  6    Period  Weeks    Status  On-going      PT LONG TERM GOAL #2   Title  Pt will  improve lumbar ROM to WNL to improve mobility    Baseline  flexion limited 75% rotation limited 50% bilateral    Time  6    Period  Weeks    Status  On-going      PT LONG TERM GOAL #3   Title  Pt will reduce pain by overall 50%    Baseline  Pain can reach a 5/10. Progressing towards goal    Time  6    Period  Weeks    Status  Partially Met      PT LONG TERM GOAL #4   Title  Pt will report improved sleeping quality due to less pain    Baseline  Sleeping better but still not sleeping through the night    Time  6    Period  Weeks    Status  On-going      PT LONG TERM GOAL #5   Title  Pt will demonstrate appropriate body mechanics with lifting as she needs to do this at work and to pick up her young child.    Baseline  showed good demonstration today    Time  6    Period  Weeks    Status  Achieved            Plan - 12/02/18 1005    Clinical Impression Statement  She had good tolerance to therapeutic activity progression today with functional lifting, push/pulls, and farmers carry. Lifting mechanics reviewed and she showed good return demonstration with this. PT will continue to progress as able with focus on  more standing activity    Examination-Participation Restrictions  Meal Prep;Cleaning;Community Activity;Laundry    Stability/Clinical Decision Making  Evolving/Moderate complexity    Rehab Potential  Good    PT Duration  6 weeks    PT Treatment/Interventions  Aquatic Therapy;Cryotherapy;Electrical Stimulation;Iontophoresis 41m/ml Dexamethasone;Moist Heat;Traction;Ultrasound;Therapeutic activities;Therapeutic exercise;Neuromuscular re-education;Manual techniques;Passive range of motion;Dry needling;Joint Manipulations;Spinal Manipulations;Taping    PT Next Visit Plan  continue to work on hip hinge; continue to progress core strength, Assess shoe lift if she brings her shoes in  Consdier sink stretch,    PT Home Exercise Plan  [facility], child pose in front and to her Lt (to stretch Rt more), HSS, bird dog, prone lying on 2 pillows; Bridge, SLR, clamshell    Consulted and Agree with Plan of Care  Patient       Patient will benefit from skilled therapeutic intervention in order to improve the following deficits and impairments:  Decreased activity tolerance, Decreased strength, Decreased range of motion, Impaired flexibility, Increased muscle spasms, Increased fascial restricitons, Postural dysfunction, Pain, Improper body mechanics  Visit Diagnosis: Chronic bilateral low back pain without sciatica     Problem List There are no active problems to display for this patient.   BSilvestre Mesi9/29/2020, 10:08 AM  [facility] [address]Phone: [phone]  Fax:  [phone] Name: EVermelle CammarataMRN: [id number] of Birth: [birth year]

## 2018-12-05 ENCOUNTER — Other Ambulatory Visit: Payer: Self-pay

## 2018-12-05 ENCOUNTER — Ambulatory Visit: Payer: BC Managed Care – PPO | Attending: Registered Nurse | Admitting: Physical Therapy

## 2018-12-05 ENCOUNTER — Encounter: Payer: Self-pay | Admitting: Physical Therapy

## 2018-12-05 DIAGNOSIS — M545 Low back pain: Secondary | ICD-10-CM | POA: Diagnosis not present

## 2018-12-05 DIAGNOSIS — G8929 Other chronic pain: Secondary | ICD-10-CM | POA: Diagnosis not present

## 2018-12-05 NOTE — Therapy (Signed)
[place], [place], [address] Phone: [phone]   Fax:  [phone]  Physical Therapy Treatment  Patient Details  Name: Erica Wilkinson MRN: [id number] Date of Birth: 16-Jan-1986 Referring Provider (PT): Maximiano Coss, NP   Encounter Date: 12/05/2018  PT End of Session - 12/05/18 1037    Visit Number  6    Number of Visits  12    Date for PT Re-Evaluation  12/25/18    Authorization Type  BCBS    PT Start Time  1540   Patient 7 mnutes late.   PT Stop Time  1100    PT Time Calculation (min)  38 min    Activity Tolerance  Patient tolerated treatment well    Behavior During Therapy  [facility] for tasks assessed/performed       History reviewed. No pertinent past medical history.  Past Surgical History:  Procedure Laterality Date  . CHOLECYSTECTOMY      There were no vitals filed for this visit.  Subjective Assessment - 12/05/18 1027    Subjective  Patient reports a little soreness after the weight carry last visit but she is having no pain today.    Pertinent History  scoliosis, recent childbirth in December 2019    Limitations  Lifting;Standing;Walking;House hold activities    How long can you sit comfortably?  not limited    How long can you stand comfortably?  has to stand 8 hours at work and pain after 6 hours    How long can you walk comfortably?  not limited unless hours of walking    Diagnostic tests  lumbar XR: There is thoracolumbar levoscoliosis with slight rotatory component. There is no fracture or spondylolisthesis.    Patient Stated Goals  get rid of the pain    Currently in Pain?  No/denies                       [facility] Adult PT Treatment/Exercise - 12/05/18 0001      Lumbar Exercises: Stretches   Passive Hamstring Stretch  Left;Right;2 reps;20 seconds   supine with strap   Passive Hamstring Stretch Limitations  with strap    Piriformis Stretch  3 reps;20  seconds;Right;Left      Lumbar Exercises: Supine   Clam Limitations  x10 with green     Bridge Limitations  2x10       Manual Therapy   Manual therapy comments  bilateral LAD 4 min              PT Education - 12/05/18 1037    Education Details  reviewed posture and lifting mechanics    Person(s) Educated  Patient    Methods  Explanation;Demonstration;Tactile cues;Verbal cues    Comprehension  Verbalized understanding;Returned demonstration;Verbal cues required;Tactile cues required          PT Long Term Goals - 12/02/18 1007      PT LONG TERM GOAL #1   Title  Pt will be I and compliant with HEP. (Target goal for all goals 6 weeks 12/25/18)    Baseline  No HEP    Time  6    Period  Weeks    Status  On-going      PT LONG TERM GOAL #2   Title  Pt will improve lumbar ROM to WNL to improve mobility    Baseline  flexion limited 75% rotation limited 50% bilateral    Time  6  Period  Weeks    Status  On-going      PT LONG TERM GOAL #3   Title  Pt will reduce pain by overall 50%    Baseline  Pain can reach a 5/10. Progressing towards goal    Time  6    Period  Weeks    Status  Partially Met      PT LONG TERM GOAL #4   Title  Pt will report improved sleeping quality due to less pain    Baseline  Sleeping better but still not sleeping through the night    Time  6    Period  Weeks    Status  On-going      PT LONG TERM GOAL #5   Title  Pt will demonstrate appropriate body mechanics with lifting as she needs to do this at work and to pick up her young child.    Baseline  showed good demonstration today    Time  6    Period  Weeks    Status  Achieved            Plan - 12/05/18 1039    Clinical Impression Statement  Patient continues to tolerate treatment well. She had no increase in pain with treatment.She tolerated functional activity better today. Therapy will continue to advance as tolerated.    Personal Factors and Comorbidities  Comorbidity 1;Time  since onset of injury/illness/exacerbation    Comorbidities  chronic pain made worse by recent childbirth, scoliosis.    Examination-Activity Limitations  Bend;Squat;Stand;Lift;Sleep    Examination-Participation Restrictions  Meal Prep;Cleaning;Community Activity;Laundry    Stability/Clinical Decision Making  Evolving/Moderate complexity    Clinical Decision Making  Moderate    PT Frequency  2x / week    PT Duration  6 weeks    PT Treatment/Interventions  Aquatic Therapy;Cryotherapy;Electrical Stimulation;Iontophoresis 27m/ml Dexamethasone;Moist Heat;Traction;Ultrasound;Therapeutic activities;Therapeutic exercise;Neuromuscular re-education;Manual techniques;Passive range of motion;Dry needling;Joint Manipulations;Spinal Manipulations;Taping    PT Next Visit Plan  continue to work on hip hinge; continue to progress core strength, Assess shoe lift if she brings her shoes in  Consdier sink stretch,    PT Home Exercise Plan  [facility], child pose in front and to her Lt (to stretch Rt more), HSS, bird dog, prone lying on 2 pillows; Bridge, SLR, clamshell    Consulted and Agree with Plan of Care  Patient       Patient will benefit from skilled therapeutic intervention in order to improve the following deficits and impairments:  Decreased activity tolerance, Decreased strength, Decreased range of motion, Impaired flexibility, Increased muscle spasms, Increased fascial restricitons, Postural dysfunction, Pain, Improper body mechanics  Visit Diagnosis: Chronic bilateral low back pain without sciatica     Problem List There are no active problems to display for this patient.   DCarney Living PT DPT  12/05/2018, 12:27 PM  [facility] [address]Phone: [phone]  Fax:  [phone] Name: EKyiesha MillwardMRN: [id number] of Birth: [birth year]

## 2018-12-09 ENCOUNTER — Ambulatory Visit: Payer: BC Managed Care – PPO | Admitting: Physical Therapy

## 2018-12-09 ENCOUNTER — Encounter: Payer: Self-pay | Admitting: Physical Therapy

## 2018-12-09 ENCOUNTER — Other Ambulatory Visit: Payer: Self-pay

## 2018-12-09 DIAGNOSIS — G8929 Other chronic pain: Secondary | ICD-10-CM

## 2018-12-09 DIAGNOSIS — M545 Low back pain: Secondary | ICD-10-CM | POA: Diagnosis not present

## 2018-12-10 ENCOUNTER — Encounter: Payer: Self-pay | Admitting: Physical Therapy

## 2018-12-10 NOTE — Therapy (Signed)
[place], [place], [address] Phone: [phone]   Fax:  [phone]  Physical Therapy Treatment  Patient Details  Name: Erica Wilkinson MRN: [id number] Date of Birth: 21-Jan-1986 Referring Provider (PT): Maximiano Coss, NP   Encounter Date: 12/09/2018  PT End of Session - 12/10/18 0746    Visit Number  7    Number of Visits  12    Date for PT Re-Evaluation  12/25/18    Authorization Type  BCBS    PT Start Time  1022    PT Stop Time  1100    PT Time Calculation (min)  38 min    Activity Tolerance  Patient tolerated treatment well    Behavior During Therapy  [facility] for tasks assessed/performed       History reviewed. No pertinent past medical history.  Past Surgical History:  Procedure Laterality Date  . CHOLECYSTECTOMY      There were no vitals filed for this visit.  Subjective Assessment - 12/09/18 1027    Subjective  Patient has no complaints today. She was a little sore after the last visit. She was a little sore after the last visit.    Pertinent History  scoliosis, recent childbirth in December 2019    Limitations  Lifting;Standing;Walking;House hold activities    How long can you sit comfortably?  not limited    How long can you stand comfortably?  has to stand 8 hours at work and pain after 6 hours    How long can you walk comfortably?  not limited unless hours of walking    Diagnostic tests  lumbar XR: There is thoracolumbar levoscoliosis with slight rotatory component. There is no fracture or spondylolisthesis.    Patient Stated Goals  get rid of the pain    Currently in Pain?  No/denies                       [facility] Adult PT Treatment/Exercise - 12/10/18 0001      Lumbar Exercises: Stretches   Passive Hamstring Stretch  Left;Right;2 reps;20 seconds   supine with strap   Passive Hamstring Stretch Limitations  with strap    Piriformis Stretch  3 reps;20 seconds;Right;Left       Lumbar Exercises: Machines for Strengthening   Other Lumbar Machine Exercise  cable machine rows 20 lbs each handle X 20 ea then push pull X 5 ea,      Lumbar Exercises: Standing   Other Standing Lumbar Exercises  box lift from stool 10lb 2x10;       Lumbar Exercises: Supine   Clam Limitations  x10 with green     Bridge Limitations  2x10       Manual Therapy   Manual therapy comments  bilateral LAD 4 min              PT Education - 12/10/18 0746    Education Details  HEP and symptom mangement    Person(s) Educated  Patient    Methods  Explanation;Demonstration;Tactile cues;Verbal cues    Comprehension  Verbalized understanding;Verbal cues required;Tactile cues required;Returned demonstration          PT Long Term Goals - 12/02/18 1007      PT LONG TERM GOAL #1   Title  Pt will be I and compliant with HEP. (Target goal for all goals 6 weeks 12/25/18)    Baseline  No HEP    Time  6  Period  Weeks    Status  On-going      PT LONG TERM GOAL #2   Title  Pt will improve lumbar ROM to WNL to improve mobility    Baseline  flexion limited 75% rotation limited 50% bilateral    Time  6    Period  Weeks    Status  On-going      PT LONG TERM GOAL #3   Title  Pt will reduce pain by overall 50%    Baseline  Pain can reach a 5/10. Progressing towards goal    Time  6    Period  Weeks    Status  Partially Met      PT LONG TERM GOAL #4   Title  Pt will report improved sleeping quality due to less pain    Baseline  Sleeping better but still not sleeping through the night    Time  6    Period  Weeks    Status  On-going      PT LONG TERM GOAL #5   Title  Pt will demonstrate appropriate body mechanics with lifting as she needs to do this at work and to pick up her young child.    Baseline  showed good demonstration today    Time  6    Period  Weeks    Status  Achieved            Plan - 12/09/18 1050    Clinical Impression Statement  Patient had no  increase inpain with treatment. Patient focused on lifting technique. She was able to increase her weight ith her weighted carry. Therapy also advanced her to light gym exercises. She had no increase in pain. Patient will have 2 more visits then likely D/C to HEP.    Personal Factors and Comorbidities  Comorbidity 1;Time since onset of injury/illness/exacerbation    Comorbidities  chronic pain made worse by recent childbirth, scoliosis.    Examination-Activity Limitations  Bend;Squat;Stand;Lift;Sleep    Stability/Clinical Decision Making  Evolving/Moderate complexity    Clinical Decision Making  Moderate    Rehab Potential  Good    PT Frequency  2x / week    PT Duration  6 weeks    PT Treatment/Interventions  Aquatic Therapy;Cryotherapy;Electrical Stimulation;Iontophoresis 52m/ml Dexamethasone;Moist Heat;Traction;Ultrasound;Therapeutic activities;Therapeutic exercise;Neuromuscular re-education;Manual techniques;Passive range of motion;Dry needling;Joint Manipulations;Spinal Manipulations;Taping    PT Next Visit Plan  continue to work on hip hinge; continue to progress core strength, Assess shoe lift if she brings her shoes in  Consdier sink stretch,    PT Home Exercise Plan  SKTC, child pose in front and to her Lt (to stretch Rt more), HSS, bird dog, prone lying on 2 pillows; Bridge, SLR, clamshell    Consulted and Agree with Plan of Care  Patient       Patient will benefit from skilled therapeutic intervention in order to improve the following deficits and impairments:  Decreased activity tolerance, Decreased strength, Decreased range of motion, Impaired flexibility, Increased muscle spasms, Increased fascial restricitons, Postural dysfunction, Pain, Improper body mechanics  Visit Diagnosis: Chronic bilateral low back pain without sciatica     Problem List There are no active problems to display for this patient.   DCarney LivingPT DPT  12/10/2018, 8:00 AM  [facility]17[address]GBricelyn [place] [address]Phone: [phone]  Fax:  [phone] Name: Erica KreamerMRN: [id number] of Birth: 221-Jul-1987

## 2018-12-12 ENCOUNTER — Encounter: Payer: Self-pay | Admitting: Physical Therapy

## 2018-12-12 ENCOUNTER — Ambulatory Visit: Payer: BC Managed Care – PPO | Admitting: Physical Therapy

## 2018-12-12 ENCOUNTER — Other Ambulatory Visit: Payer: Self-pay

## 2018-12-12 DIAGNOSIS — M545 Low back pain: Secondary | ICD-10-CM | POA: Diagnosis not present

## 2018-12-12 DIAGNOSIS — G8929 Other chronic pain: Secondary | ICD-10-CM

## 2018-12-12 NOTE — Therapy (Signed)
[place], [place], [address] Phone: [phone]   Fax:  [phone]  Physical Therapy Treatment  Patient Details  Name: Erica Wilkinson MRN: [id number] Date of Birth: 03/22/1985 Referring Provider (PT): Maximiano Coss, NP   Encounter Date: 12/12/2018  PT End of Session - 12/12/18 0958    Visit Number  8    Number of Visits  12    Date for PT Re-Evaluation  12/25/18    Authorization Type  BCBS    PT Start Time  0933    PT Stop Time  1015    PT Time Calculation (min)  42 min    Activity Tolerance  Patient tolerated treatment well    Behavior During Therapy  [facility] for tasks assessed/performed       History reviewed. No pertinent past medical history.  Past Surgical History:  Procedure Laterality Date  . CHOLECYSTECTOMY      There were no vitals filed for this visit.  Subjective Assessment - 12/12/18 0957    Subjective  Patient has had very little pain. She went shopping and held her son and felt her back did pretrty good.    Pertinent History  scoliosis, recent childbirth in December 2019    Limitations  Lifting;Standing;Walking;House hold activities    How long can you sit comfortably?  not limited    How long can you stand comfortably?  has to stand 8 hours at work and pain after 6 hours    How long can you walk comfortably?  not limited unless hours of walking    Diagnostic tests  lumbar XR: There is thoracolumbar levoscoliosis with slight rotatory component. There is no fracture or spondylolisthesis.    Patient Stated Goals  get rid of the pain    Currently in Pain?  No/denies                       [facility] Adult PT Treatment/Exercise - 12/12/18 0001      Lumbar Exercises: Stretches   Passive Hamstring Stretch  Left;Right;2 reps;20 seconds   supine with strap   Passive Hamstring Stretch Limitations  with strap    Piriformis Stretch  3 reps;20 seconds;Right;Left      Lumbar  Exercises: Machines for Strengthening   Other Lumbar Machine Exercise  cable machine rows 20 lbs each handle X 20 ea then push pull X 5 ea,      Lumbar Exercises: Standing   Other Standing Lumbar Exercises  box lift from stool 10lb 2x10;       Lumbar Exercises: Supine   Clam Limitations  x10 with green     Bridge Limitations  2x10       Manual Therapy   Manual therapy comments  bilateral LAD 4 min              PT Education - 12/12/18 0957    Education Details  reviewed HEP and lifting technique    Person(s) Educated  Patient    Methods  Tactile cues;Demonstration;Verbal cues;Explanation    Comprehension  Verbalized understanding;Returned demonstration;Verbal cues required;Tactile cues required          PT Long Term Goals - 12/02/18 1007      PT LONG TERM GOAL #1   Title  Pt will be I and compliant with HEP. (Target goal for all goals 6 weeks 12/25/18)    Baseline  No HEP    Time  6    Period  Weeks    Status  On-going      PT LONG TERM GOAL #2   Title  Pt will improve lumbar ROM to WNL to improve mobility    Baseline  flexion limited 75% rotation limited 50% bilateral    Time  6    Period  Weeks    Status  On-going      PT LONG TERM GOAL #3   Title  Pt will reduce pain by overall 50%    Baseline  Pain can reach a 5/10. Progressing towards goal    Time  6    Period  Weeks    Status  Partially Met      PT LONG TERM GOAL #4   Title  Pt will report improved sleeping quality due to less pain    Baseline  Sleeping better but still not sleeping through the night    Time  6    Period  Weeks    Status  On-going      PT LONG TERM GOAL #5   Title  Pt will demonstrate appropriate body mechanics with lifting as she needs to do this at work and to pick up her young child.    Baseline  showed good demonstration today    Time  6    Period  Weeks    Status  Achieved            Plan - 12/12/18 0958    Clinical Impression Statement  Patient is progressing  well. She continues to tolerate functional strengthening very well. She was able to do box lifts today without pain. She will liikley be ready for discharge to HEP next visit.    Personal Factors and Comorbidities  Comorbidity 1;Time since onset of injury/illness/exacerbation    Comorbidities  chronic pain made worse by recent childbirth, scoliosis.    Examination-Activity Limitations  Bend;Squat;Stand;Lift;Sleep    Examination-Participation Restrictions  Meal Prep;Cleaning;Community Activity;Laundry    Stability/Clinical Decision Making  Evolving/Moderate complexity    Clinical Decision Making  Moderate    Rehab Potential  Good    PT Frequency  2x / week    PT Duration  6 weeks    PT Treatment/Interventions  Aquatic Therapy;Cryotherapy;Electrical Stimulation;Iontophoresis 56m/ml Dexamethasone;Moist Heat;Traction;Ultrasound;Therapeutic activities;Therapeutic exercise;Neuromuscular re-education;Manual techniques;Passive range of motion;Dry needling;Joint Manipulations;Spinal Manipulations;Taping    PT Next Visit Plan  continue to work on hip hinge; continue to progress core strength, Assess shoe lift if she brings her shoes in  Consdier sink stretch, re-assess for likley dischagre.    PT Home Exercise Plan  [facility] in front and to her Lt (to stretch Rt more), HSS, bird dog, prone lying on 2 pillows; Bridge, SLR, clamshell    Consulted and Agree with Plan of Care  Patient       Patient will benefit from skilled therapeutic intervention in order to improve the following deficits and impairments:  Decreased activity tolerance, Decreased strength, Decreased range of motion, Impaired flexibility, Increased muscle spasms, Increased fascial restricitons, Postural dysfunction, Pain, Improper body mechanics  Visit Diagnosis: Chronic bilateral low back pain without sciatica     Problem List There are no active problems to display for this patient.   DCarney LivingPT DPT  12/12/2018,  12:56 PM  [facility]GEudora [address] [address]Phone: [phone]  Fax:  [phone] Name: Erica DangMRN: [id number] of Birth: 2May 18, 1987

## 2018-12-16 ENCOUNTER — Ambulatory Visit: Payer: BC Managed Care – PPO | Admitting: Physical Therapy

## 2018-12-16 ENCOUNTER — Other Ambulatory Visit: Payer: Self-pay

## 2018-12-16 DIAGNOSIS — M545 Low back pain: Secondary | ICD-10-CM | POA: Diagnosis not present

## 2018-12-16 DIAGNOSIS — G8929 Other chronic pain: Secondary | ICD-10-CM

## 2018-12-16 NOTE — Therapy (Signed)
[place], [place], [address] Phone: [phone]   Fax:  [phone]  Physical Therapy Treatment/Discharge PHYSICAL THERAPY DISCHARGE SUMMARY  Visits from Start of Care: 9  Current functional level related to goals / functional outcomes: See below   Remaining deficits: See below   Education / Equipment: HEP Plan: Patient agrees to discharge.  Patient goals were met. Patient is being discharged due to meeting the stated rehab goals.  ?????       Patient Details  Name: Erica Wilkinson MRN: [id number] Date of Birth: Apr 15, 1985 Referring Provider (PT): Maximiano Coss, NP   Encounter Date: 12/16/2018  PT End of Session - 12/16/18 1047    Visit Number  9    Number of Visits  12    Date for PT Re-Evaluation  12/25/18    Authorization Type  BCBS    PT Start Time  1007    PT Stop Time  1045    PT Time Calculation (min)  38 min    Activity Tolerance  Patient tolerated treatment well    Behavior During Therapy  [facility] for tasks assessed/performed       No past medical history on file.  Past Surgical History:  Procedure Laterality Date  . CHOLECYSTECTOMY      There were no vitals filed for this visit.  Subjective Assessment - 12/16/18 1030    Subjective  NO pain, feels ready for discharge, able to sleep without any pain    Pertinent History  scoliosis, recent childbirth in December 2019    Limitations  Lifting;Standing;Walking;House hold activities    How long can you sit comfortably?  not limited    How long can you stand comfortably?  has to stand 8 hours at work and pain after 6 hours    How long can you walk comfortably?  not limited unless hours of walking    Diagnostic tests  lumbar XR: There is thoracolumbar levoscoliosis with slight rotatory component. There is no fracture or spondylolisthesis.    Patient Stated Goals  get rid of the pain    Currently in Pain?  No/denies    Pain Onset   More than a month ago         [facility] PT Assessment - 12/16/18 0001      Assessment   Medical Diagnosis  Lower back pain r/t scoliosis.    Referring Provider (PT)  Maximiano Coss, NP      AROM   Overall AROM Comments  lumbar ROM WNL all planes      Strength   Overall Strength Comments  leg strength WNL bilat                   [facility] Adult PT Treatment/Exercise - 12/16/18 0001      Lumbar Exercises: Stretches   Passive Hamstring Stretch  Left;Right;2 reps;20 seconds    Passive Hamstring Stretch Limitations  with strap    Piriformis Stretch  3 reps;20 seconds;Right;Left      Lumbar Exercises: Aerobic   Tread Mill  5 min warmup 2.0 MPH      Lumbar Exercises: Standing   Other Standing Lumbar Exercises  KB deadlift from 8 inch step 15 lbs X 10    Other Standing Lumbar Exercises  cable rows 10 lb each handle X 20, then cable walkouts X 5 fwd and retro      Lumbar Exercises: Supine   Clam Limitations  X20 with green  Dead Bug Limitations  X10, fully extended    Bridge Limitations  X10 with alt leg extension      Lumbar Exercises: Sidelying   Other Sidelying Lumbar Exercises  mod plank from knees 30 sec X 2 on each side and in front             PT Education - 12/16/18 1047    Education Details  final HEP review and plan to discharge    Person(s) Educated  Patient    Methods  Explanation    Comprehension  Verbalized understanding;Returned demonstration          PT Long Term Goals - 12/16/18 1256      PT LONG TERM GOAL #1   Title  Pt will be I and compliant with HEP. (Target goal for all goals 6 weeks 12/25/18)    Baseline  No HEP    Time  6    Period  Weeks    Status  Achieved      PT LONG TERM GOAL #2   Title  Pt will improve lumbar ROM to WNL to improve mobility    Baseline  now WNL    Time  6    Period  Weeks    Status  Achieved      PT LONG TERM GOAL #3   Title  Pt will reduce pain by overall 50%    Baseline  now no pain    Time  6     Period  Weeks    Status  Achieved      PT LONG TERM GOAL #4   Title  Pt will report improved sleeping quality due to less pain    Baseline  no pain    Time  6    Period  Weeks    Status  Achieved      PT LONG TERM GOAL #5   Title  Pt will demonstrate appropriate body mechanics with lifting as she needs to do this at work and to pick up her young child.    Baseline  showed good demonstration today    Time  6    Period  Weeks    Status  Achieved            Plan - 12/16/18 1255    Clinical Impression Statement  Pt has made great progres. She has met all PT goals and not having pain. She will be discharged from PT and she agrees, she has no questions or concerns.    Personal Factors and Comorbidities  Comorbidity 1;Time since onset of injury/illness/exacerbation    Comorbidities  chronic pain made worse by recent childbirth, scoliosis.    Examination-Activity Limitations  Bend;Squat;Stand;Lift;Sleep    Examination-Participation Restrictions  Meal Prep;Cleaning;Community Activity;Laundry    Stability/Clinical Decision Making  Evolving/Moderate complexity    Clinical Decision Making  Moderate    Rehab Potential  Good    PT Frequency  2x / week    PT Duration  6 weeks    PT Treatment/Interventions  Aquatic Therapy;Cryotherapy;Electrical Stimulation;Iontophoresis 81m/ml Dexamethasone;Moist Heat;Traction;Ultrasound;Therapeutic activities;Therapeutic exercise;Neuromuscular re-education;Manual techniques;Passive range of motion;Dry needling;Joint Manipulations;Spinal Manipulations;Taping    PT Next Visit Plan  continue to work on hip hinge; continue to progress core strength, Assess shoe lift if she brings her shoes in  Consdier sink stretch, re-assess for likley dischagre.    PT Home Exercise Plan  [facility] and to her Lt (to stretch Rt more), HSS, bird dog, prone lying on  2 pillows; Bridge, SLR, clamshell    Consulted and Agree with Plan of Care  Patient        Patient will benefit from skilled therapeutic intervention in order to improve the following deficits and impairments:  Decreased activity tolerance, Decreased strength, Decreased range of motion, Impaired flexibility, Increased muscle spasms, Increased fascial restricitons, Postural dysfunction, Pain, Improper body mechanics  Visit Diagnosis: Chronic bilateral low back pain without sciatica     Problem List There are no active problems to display for this patient.   Silvestre Mesi 12/16/2018, 12:57 PM  [facility] [address] [place], [place], [address] Phone: [phone]   Fax:  [phone]  Name: Erica Wilkinson MRN: [id number] Date of Birth: 1985-06-20

## 2019-02-02 ENCOUNTER — Other Ambulatory Visit: Payer: Self-pay

## 2019-02-02 DIAGNOSIS — Z20822 Contact with and (suspected) exposure to covid-19: Secondary | ICD-10-CM

## 2019-02-03 LAB — NOVEL CORONAVIRUS, NAA: SARS-CoV-2, NAA: NOT DETECTED

## 2019-12-08 ENCOUNTER — Ambulatory Visit (INDEPENDENT_AMBULATORY_CARE_PROVIDER_SITE_OTHER): Payer: Medicaid Other | Admitting: Registered Nurse

## 2019-12-08 ENCOUNTER — Other Ambulatory Visit: Payer: Self-pay

## 2019-12-08 ENCOUNTER — Encounter: Payer: Self-pay | Admitting: Registered Nurse

## 2019-12-08 VITALS — BP 143/86 | HR 93 | Temp 97.9°F | Resp 18 | Ht 64.0 in | Wt 254.6 lb

## 2019-12-08 DIAGNOSIS — M5441 Lumbago with sciatica, right side: Secondary | ICD-10-CM | POA: Diagnosis not present

## 2019-12-08 NOTE — Patient Instructions (Signed)
° ° ° °  If you have lab work done today you will be contacted with your lab results within the next 2 weeks.  If you have not heard from us then please contact us. The fastest way to get your results is to register for My Chart. ° ° °IF you received an x-ray today, you will receive an invoice from [facility]. Please contact [facility] at [phone] with questions or concerns regarding your invoice.  ° °IF you received labwork today, you will receive an invoice from [facility]. Please contact [facility] at [phone] with questions or concerns regarding your invoice.  ° °Our billing staff will not be able to assist you with questions regarding bills from these companies. ° °You will be contacted with the lab results as soon as they are available. The fastest way to get your results is to activate your My Chart account. Instructions are located on the last page of this paperwork. If you have not heard from us regarding the results in 2 weeks, please contact this office. °  ° ° ° °

## 2019-12-09 ENCOUNTER — Telehealth: Payer: Self-pay | Admitting: Registered Nurse

## 2019-12-09 NOTE — Telephone Encounter (Signed)
Pt was seen yesterday for leg pain. Was she supposed to get an RX? There is not one on file.

## 2019-12-09 NOTE — Telephone Encounter (Signed)
Pt called because she went to the pharmacy for her prescription but it wash no prescription send to the pharmacy please advices

## 2019-12-17 MED ORDER — METHOCARBAMOL 500 MG PO TABS: 500.0000 mg | ORAL_TABLET | Freq: Four times a day (QID) | ORAL | 0 refills | Status: AC

## 2019-12-17 MED ORDER — DICLOFENAC SODIUM 75 MG PO TBEC: 75.0000 mg | DELAYED_RELEASE_TABLET | Freq: Two times a day (BID) | ORAL | 0 refills | Status: AC

## 2019-12-21 ENCOUNTER — Telehealth: Payer: Self-pay | Admitting: Registered Nurse

## 2019-12-21 NOTE — Telephone Encounter (Signed)
Pt is calling because all she has gotten  muscle relaxers  And the Prescription strength tylenol   Patient states that they also discussed pressure in ear  And Provider was supposed to send in 2 nasal sprays  I cant see order for the nasal spray    Please advise

## 2019-12-21 NOTE — Telephone Encounter (Signed)
Pt states on last visit she was supposed to have 2 referrals made for a neurosurgery and an ENT    Please advise

## 2019-12-22 NOTE — Telephone Encounter (Signed)
Patient stated she was supposed to received a nasal spray.

## 2019-12-22 NOTE — Telephone Encounter (Signed)
Please sign referral to ENT and neurosurgeon if applicable.

## 2020-01-05 NOTE — Telephone Encounter (Signed)
Pt is calling about this referral pt is very upset and stated that no one has caller her about this. Pt would like a call. Please advise.

## 2020-01-05 NOTE — Telephone Encounter (Signed)
Pt pt call was returned with no answer, message sent to [facility] to follow up with pt

## 2020-01-31 ENCOUNTER — Encounter: Payer: Self-pay | Admitting: Registered Nurse

## 2020-01-31 NOTE — Progress Notes (Signed)
Acute Office Visit  Subjective:    Patient ID: Erica Wilkinson, female    DOB: [birth year], 34 y.o.   MRN: [id number]  Chief Complaint  Patient presents with  . Leg Pain    Patient has been having shoot pain in the leg for over the year but its getting worse , and comes and goes. Per patient she takes tylenol but has no relief    HPI Patient is in today for leg pain. Lower R back, buttock, and R leg has shooting pain Ongoing for some time but now getting to where it interferes with ADLs and keeps her awake at night No acute injury noted No hx of spinal injury Denies saddle anesthesia No numbness in feet No swelling in legs ROM roughly full  No past medical history on file.  Past Surgical History:  Procedure Laterality Date  . CHOLECYSTECTOMY      Family History  Problem Relation Age of Onset  . Endometrial cancer Mother   . Leukemia Father   . Liver cancer Father   . Heart disease Father   . Heart disease Brother   . Diabetes Maternal Grandmother   . Stroke Maternal Grandfather   . Cancer Paternal Grandmother   . Heart disease Paternal Grandmother   . Stroke Paternal Grandfather   . Cancer Paternal Grandfather     Social History   Socioeconomic History  . Marital status: Significant Other    Spouse name: Not on file  . Number of children: 1  . Years of education: Not on file  . Highest education level: Not on file  Occupational History  . Not on file  Tobacco Use  . Smoking status: Former Smoker    Packs/day: 0.75    Years: 10.00    Pack years: 7.50    Start date: 02/09/2007    Quit date: 02/08/2017    Years since quitting: 2.9  . Smokeless tobacco: Never Used  Vaping Use  . Vaping Use: Never used  Substance and Sexual Activity  . Alcohol use: Yes    Comment: occ, not even weekly  . Drug use: Never  . Sexual activity: Yes    Partners: Male  Other Topics Concern  . Not on file  Social History Narrative  . Not on file   Social Determinants of  Health   Financial Resource Strain:   . Difficulty of Paying Living Expenses: Not on file  Food Insecurity:   . Worried About Charity fundraiser in the Last Year: Not on file  . Ran Out of Food in the Last Year: Not on file  Transportation Needs:   . Lack of Transportation (Medical): Not on file  . Lack of Transportation (Non-Medical): Not on file  Physical Activity:   . Days of Exercise per Week: Not on file  . Minutes of Exercise per Session: Not on file  Stress:   . Feeling of Stress : Not on file  Social Connections:   . Frequency of Communication with Friends and Family: Not on file  . Frequency of Social Gatherings with Friends and Family: Not on file  . Attends Religious Services: Not on file  . Active Member of Clubs or Organizations: Not on file  . Attends Archivist Meetings: Not on file  . Marital Status: Not on file  Intimate Partner Violence:   . Fear of Current or Ex-Partner: Not on file  . Emotionally Abused: Not on file  . Physically Abused: Not on file  .  Sexually Abused: Not on file    No outpatient medications prior to visit.   No facility-administered medications prior to visit.    No Known Allergies  Review of Systems  Constitutional: Negative.   HENT: Negative.   Eyes: Negative.   Respiratory: Negative.   Cardiovascular: Negative.   Gastrointestinal: Negative.   Genitourinary: Negative.   Musculoskeletal: Negative.   Skin: Negative.   Neurological: Negative.   Psychiatric/Behavioral: Negative.        Objective:    Physical Exam Vitals and nursing note reviewed.  Constitutional:      Appearance: Normal appearance.  Cardiovascular:     Rate and Rhythm: Normal rate and regular rhythm.     Heart sounds: Normal heart sounds.  Pulmonary:     Effort: Pulmonary effort is normal. No respiratory distress.     Breath sounds: Normal breath sounds.  Musculoskeletal:        General: Tenderness (mild lower r back) present. No swelling,  deformity or signs of injury. Normal range of motion.     Cervical back: Normal range of motion and neck supple. No rigidity.     Right lower leg: No edema.     Left lower leg: No edema.     Comments: Positive straight leg raise on R side  Skin:    General: Skin is warm and dry.  Neurological:     General: No focal deficit present.     Mental Status: She is alert and oriented to person, place, and time. Mental status is at baseline.  Psychiatric:        Mood and Affect: Mood normal.        Behavior: Behavior normal.        Thought Content: Thought content normal.        Judgment: Judgment normal.     BP (!) 143/86   Pulse 93   Temp 97.9 F (36.6 C) (Temporal)   Resp 18   Ht 5' 4"  (1.626 m)   Wt 254 lb 9.6 oz (115.5 kg)   SpO2 96%   BMI 43.70 kg/m  Wt Readings from Last 3 Encounters:  12/08/19 254 lb 9.6 oz (115.5 kg)  10/10/18 242 lb (109.8 kg)    Health Maintenance Due  Topic Date Due  . COVID-19 Vaccine (1) Never done  . PAP SMEAR-Modifier  Never done    There are no preventive care reminders to display for this patient.   No results found for: TSH No results found for: WBC, HGB, HCT, MCV, PLT No results found for: NA, K, CHLORIDE, CO2, GLUCOSE, BUN, CREATININE, BILITOT, ALKPHOS, AST, ALT, PROT, ALBUMIN, CALCIUM, ANIONGAP, EGFR, GFR No results found for: CHOL No results found for: HDL No results found for: LDLCALC No results found for: TRIG No results found for: CHOLHDL No results found for: HGBA1C     Assessment & Plan:   Problem List Items Addressed This Visit    None    Visit Diagnoses    Acute bilateral low back pain with right-sided sciatica    -  Primary   Relevant Medications   diclofenac (VOLTAREN) 75 MG EC tablet   methocarbamol (ROBAXIN) 500 MG tablet       Meds ordered this encounter  Medications  . diclofenac (VOLTAREN) 75 MG EC tablet    Sig: Take 1 tablet (75 mg total) by mouth 2 (two) times daily.    Dispense:  30 tablet     Refill:  0    Order Specific  Question:   Supervising Provider    Answer:   Carlota Raspberry, JEFFREY R [[id number]]  . methocarbamol (ROBAXIN) 500 MG tablet    Sig: Take 1 tablet (500 mg total) by mouth 4 (four) times daily.    Dispense:  60 tablet    Refill:  0    Order Specific Question:   Supervising Provider    Answer:   Carlota Raspberry, JEFFREY R [[id number]]   PLAN  Suspect muscle spasm with sciatica  Diclofenac and methocarbamol  Light stretching at home  Regular exercise  Return if worsening or failing to improve  Patient encouraged to call clinic with any questions, comments, or concerns.   Maximiano Coss, NP

## 2020-02-08 ENCOUNTER — Telehealth: Payer: Self-pay | Admitting: Registered Nurse

## 2020-02-08 DIAGNOSIS — M5441 Lumbago with sciatica, right side: Secondary | ICD-10-CM

## 2020-02-08 DIAGNOSIS — M545 Low back pain, unspecified: Secondary | ICD-10-CM

## 2020-02-08 DIAGNOSIS — J329 Chronic sinusitis, unspecified: Secondary | ICD-10-CM

## 2020-02-08 DIAGNOSIS — G8929 Other chronic pain: Secondary | ICD-10-CM

## 2020-02-08 NOTE — Telephone Encounter (Signed)
Check phone encounter from 12/21/19. Pt feels she was suppose to have 2 referrals placed form her 12/08/19 app. Please advise pt, she is becoming very frustrated. [phone]

## 2020-02-08 NOTE — Telephone Encounter (Signed)
The ENT and Neurosurgery referral has been pended for provider to sign off of if the referrals are appropriate. I will call pt back after the referrals have been sent.   Please advise if these referrals are appropriate to sign off on.

## 2020-04-01 IMAGING — DX LUMBAR SPINE - COMPLETE 4+ VIEW
5 series · 5 of 5 positions shown · non-contrast
Comparison: None.

CLINICAL DATA: Lumbago following twisting injury

EXAM:
LUMBAR SPINE - COMPLETE 4+ VIEW

[l-spine ap]
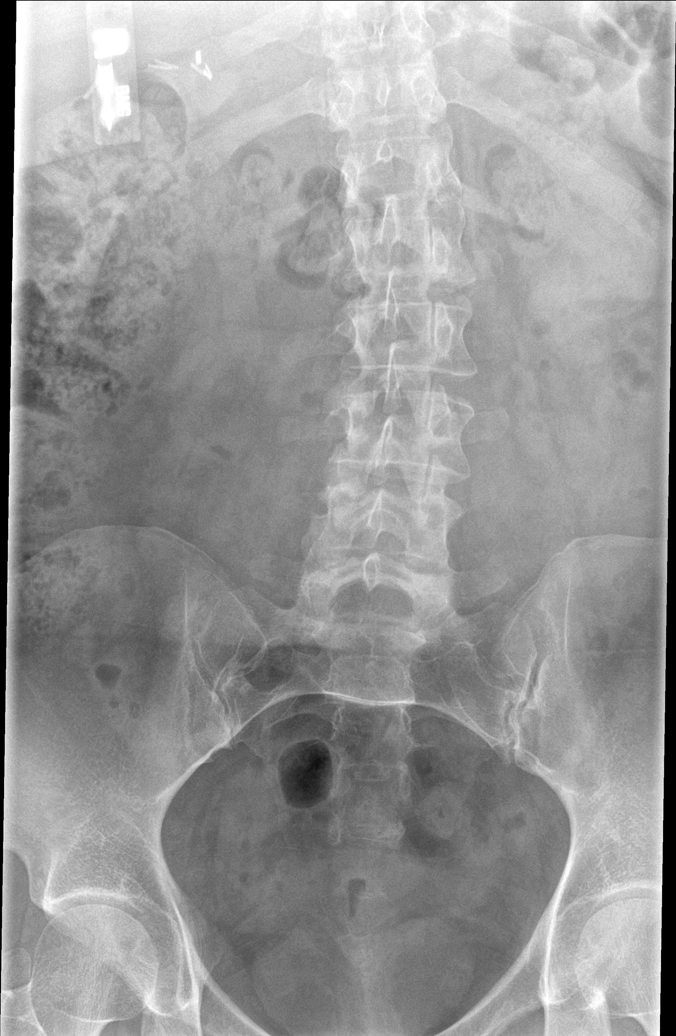

[l-spine obl (1 of 2)]
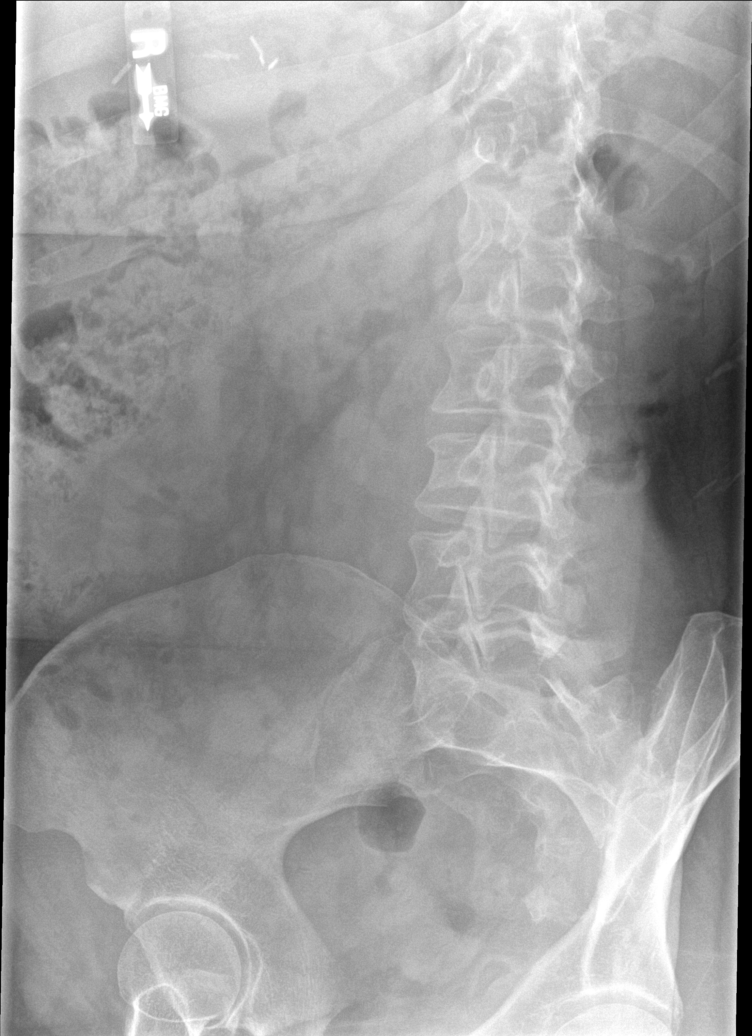

[l-spine obl (2 of 2)]
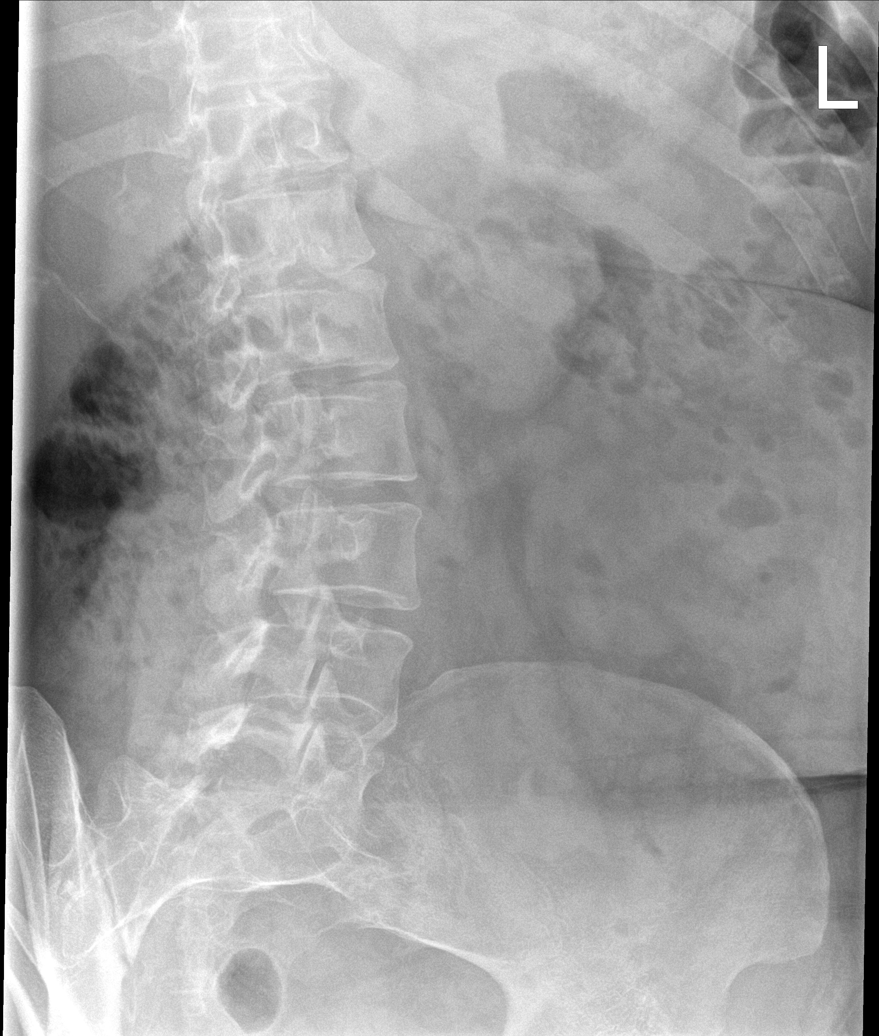

[l-spine lat]
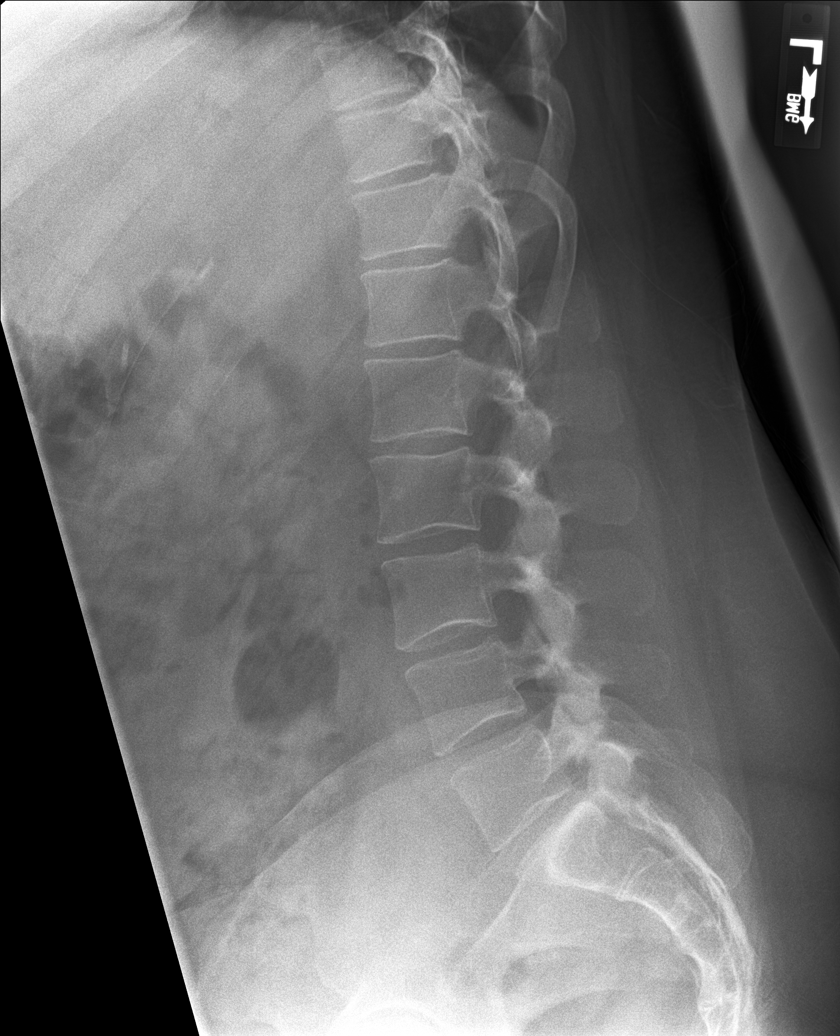

[l-spine l5-s1]
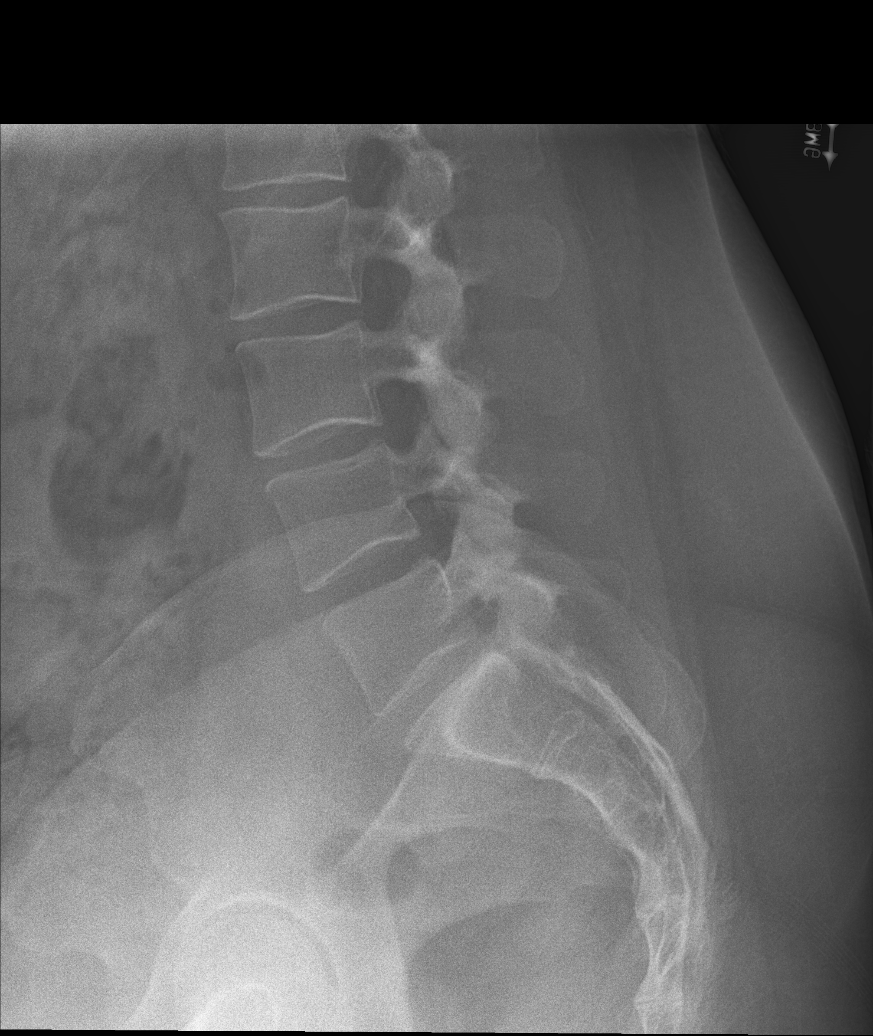

[5 of 5 positions shown; findings below may reference images not displayed]

FINDINGS: Frontal, lateral, spot lumbosacral lateral, and bilateral oblique
views were obtained. There are 5 non-rib-bearing lumbar type
vertebral bodies. T12 ribs are hypoplastic. There is thoracolumbar
levoscoliosis with slight rotatory component. There is no fracture
or spondylolisthesis. The disc spaces appear unremarkable. There is
no appreciable facet arthropathy.
IMPRESSION: Scoliosis. No fracture or spondylolisthesis. No appreciable
arthropathy.

## 2020-06-16 NOTE — Telephone Encounter (Signed)
Opened in error
